# Patient Record
Sex: Male | Born: 1975 | Hispanic: Yes | Marital: Single | Smoking: Current every day smoker
Health system: Southern US, Community
[De-identification: ages and names within clinical notes are randomized; demographics above are authoritative.]

## PROBLEM LIST (undated history)

## (undated) DIAGNOSIS — F1910 Other psychoactive substance abuse, uncomplicated: Secondary | ICD-10-CM

## (undated) DIAGNOSIS — K529 Noninfective gastroenteritis and colitis, unspecified: Secondary | ICD-10-CM

---

## 2020-06-13 ENCOUNTER — Encounter: Payer: Self-pay | Admitting: Medical Oncology

## 2020-06-13 ENCOUNTER — Other Ambulatory Visit: Payer: Self-pay

## 2020-06-13 ENCOUNTER — Observation Stay
Admission: EM | Admit: 2020-06-13 | Discharge: 2020-06-14 | Disposition: A | Discharge status: Skilled Nursing Facility | Payer: Self-pay | Attending: Internal Medicine | Admitting: Internal Medicine

## 2020-06-13 ENCOUNTER — Emergency Department: Payer: Self-pay

## 2020-06-13 DIAGNOSIS — E870 Hyperosmolality and hypernatremia: Secondary | ICD-10-CM | POA: Insufficient documentation

## 2020-06-13 DIAGNOSIS — R030 Elevated blood-pressure reading, without diagnosis of hypertension: Secondary | ICD-10-CM | POA: Insufficient documentation

## 2020-06-13 DIAGNOSIS — E860 Dehydration: Secondary | ICD-10-CM | POA: Insufficient documentation

## 2020-06-13 DIAGNOSIS — F1411 Cocaine abuse, in remission: Secondary | ICD-10-CM | POA: Insufficient documentation

## 2020-06-13 DIAGNOSIS — N179 Acute kidney failure, unspecified: Secondary | ICD-10-CM | POA: Insufficient documentation

## 2020-06-13 DIAGNOSIS — F1211 Cannabis abuse, in remission: Secondary | ICD-10-CM | POA: Insufficient documentation

## 2020-06-13 DIAGNOSIS — R112 Nausea with vomiting, unspecified: Principal | ICD-10-CM | POA: Diagnosis present

## 2020-06-13 DIAGNOSIS — F17210 Nicotine dependence, cigarettes, uncomplicated: Secondary | ICD-10-CM | POA: Insufficient documentation

## 2020-06-13 DIAGNOSIS — F1511 Other stimulant abuse, in remission: Secondary | ICD-10-CM | POA: Insufficient documentation

## 2020-06-13 DIAGNOSIS — F1910 Other psychoactive substance abuse, uncomplicated: Secondary | ICD-10-CM | POA: Clinically undetermined

## 2020-06-13 DIAGNOSIS — Z720 Tobacco use: Secondary | ICD-10-CM | POA: Diagnosis present

## 2020-06-13 DIAGNOSIS — Z20822 Contact with and (suspected) exposure to covid-19: Secondary | ICD-10-CM | POA: Insufficient documentation

## 2020-06-13 HISTORY — DX: Other psychoactive substance abuse, uncomplicated: F19.10

## 2020-06-13 LAB — CBC
HCT: 44.8 % (ref 39.0–52.0)
Hemoglobin: 14.6 g/dL (ref 13.0–17.0)
MCH: 29.3 pg (ref 26.0–34.0)
MCHC: 32.6 g/dL (ref 30.0–36.0)
MCV: 90 fL (ref 80.0–100.0)
Platelets: 293 10*3/uL (ref 150–400)
RBC: 4.98 MIL/uL (ref 4.22–5.81)
RDW: 14.2 % (ref 11.5–15.5)
WBC: 11.5 10*3/uL — ABNORMAL HIGH (ref 4.0–10.5)
nRBC: 0 % (ref 0.0–0.2)

## 2020-06-13 LAB — LIPASE, BLOOD: Lipase: 45 U/L (ref 11–51)

## 2020-06-13 LAB — URINALYSIS, COMPLETE (UACMP) WITH MICROSCOPIC
Bacteria, UA: NONE SEEN
Bilirubin Urine: NEGATIVE
Glucose, UA: NEGATIVE mg/dL
Hgb urine dipstick: NEGATIVE
Ketones, ur: NEGATIVE mg/dL
Leukocytes,Ua: NEGATIVE
Nitrite: NEGATIVE
Protein, ur: NEGATIVE mg/dL
Specific Gravity, Urine: >1.046 — ABNORMAL HIGH (ref 1.005–1.030)
Squamous Epithelial / HPF: NONE SEEN (ref 0–5)
pH: 8 (ref 5.0–8.0)

## 2020-06-13 LAB — COMPREHENSIVE METABOLIC PANEL
ALT: 15 U/L (ref 0–44)
AST: 21 U/L (ref 15–41)
Albumin: 5 g/dL (ref 3.5–5.0)
Alkaline Phosphatase: 41 U/L (ref 38–126)
Anion gap: 12 (ref 5–15)
BUN: 18 mg/dL (ref 6–20)
CO2: 26 mmol/L (ref 22–32)
Calcium: 10.2 mg/dL (ref 8.9–10.3)
Chloride: 109 mmol/L (ref 98–111)
Creatinine, Ser: 1.31 mg/dL — ABNORMAL HIGH (ref 0.61–1.24)
GFR calc Af Amer: >60 mL/min (ref >60)
GFR calc non Af Amer: >60 mL/min (ref >60)
Glucose, Bld: 145 mg/dL — ABNORMAL HIGH (ref 70–99)
Potassium: 4.7 mmol/L (ref 3.5–5.1)
Sodium: 147 mmol/L — ABNORMAL HIGH (ref 135–145)
Total Bilirubin: 1.2 mg/dL (ref 0.3–1.2)
Total Protein: 8.1 g/dL (ref 6.5–8.1)

## 2020-06-13 LAB — URINE DRUG SCREEN, QUALITATIVE (ARMC ONLY)
Amphetamines, Ur Screen: NOT DETECTED
Barbiturates, Ur Screen: NOT DETECTED
Benzodiazepine, Ur Scrn: NOT DETECTED
Cannabinoid 50 Ng, Ur ~~LOC~~: NOT DETECTED
Cocaine Metabolite,Ur ~~LOC~~: NOT DETECTED
MDMA (Ecstasy)Ur Screen: NOT DETECTED
Methadone Scn, Ur: NOT DETECTED
Opiate, Ur Screen: POSITIVE — AB
Phencyclidine (PCP) Ur S: NOT DETECTED
Tricyclic, Ur Screen: NOT DETECTED

## 2020-06-13 LAB — ETHANOL: Alcohol, Ethyl (B): <10 mg/dL (ref <10)

## 2020-06-13 LAB — SARS CORONAVIRUS 2 BY RT PCR (HOSPITAL ORDER, PERFORMED IN ~~LOC~~ HOSPITAL LAB): SARS Coronavirus 2: NEGATIVE

## 2020-06-13 LAB — LACTIC ACID, PLASMA: Lactic Acid, Venous: 2.7 mmol/L (ref 0.5–1.9)

## 2020-06-13 MED ORDER — ACETAMINOPHEN 325 MG PO TABS
650.0000 mg | ORAL_TABLET | Freq: Once | ORAL | Status: DC
  Filled 2020-06-13: qty 2

## 2020-06-13 MED ORDER — HALOPERIDOL LACTATE 5 MG/ML IJ SOLN
INTRAMUSCULAR | Status: AC
  Filled 2020-06-13: qty 1

## 2020-06-13 MED ORDER — MORPHINE SULFATE (PF) 4 MG/ML IV SOLN
4.0000 mg | Freq: Once | INTRAVENOUS | Status: AC
  Administered 2020-06-13: 4 mg via INTRAVENOUS
  Filled 2020-06-13: qty 1

## 2020-06-13 MED ORDER — DROPERIDOL 2.5 MG/ML IJ SOLN
5.0000 mg | Freq: Once | INTRAMUSCULAR | Status: DC
  Filled 2020-06-13: qty 2

## 2020-06-13 MED ORDER — NICOTINE 7 MG/24HR TD PT24
7.0000 mg | MEDICATED_PATCH | Freq: Every day | TRANSDERMAL | Status: DC
  Filled 2020-06-13 (×3): qty 1

## 2020-06-13 MED ORDER — HYDRALAZINE HCL 20 MG/ML IJ SOLN
10.0000 mg | Freq: Four times a day (QID) | INTRAMUSCULAR | Status: DC | PRN
  Filled 2020-06-13: qty 0.5

## 2020-06-13 MED ORDER — LORAZEPAM 2 MG/ML IJ SOLN
1.0000 mg | Freq: Four times a day (QID) | INTRAMUSCULAR | Status: DC | PRN
  Administered 2020-06-14: 1 mg via INTRAVENOUS
  Filled 2020-06-13: qty 1

## 2020-06-13 MED ORDER — LORAZEPAM 2 MG/ML IJ SOLN
1.0000 mg | Freq: Once | INTRAMUSCULAR | Status: AC
  Administered 2020-06-13: 1 mg via INTRAVENOUS
  Filled 2020-06-13: qty 1

## 2020-06-13 MED ORDER — IOHEXOL 300 MG/ML  SOLN
100.0000 mL | Freq: Once | INTRAMUSCULAR | Status: AC | PRN
  Administered 2020-06-13: 100 mL via INTRAVENOUS
  Filled 2020-06-13: qty 100

## 2020-06-13 MED ORDER — HALOPERIDOL LACTATE 5 MG/ML IJ SOLN
2.5000 mg | Freq: Once | INTRAMUSCULAR | Status: AC
  Administered 2020-06-13: 2.5 mg via INTRAVENOUS

## 2020-06-13 MED ORDER — ACETAMINOPHEN 325 MG PO TABS: 650.0000 mg | ORAL_TABLET | Freq: Four times a day (QID) | ORAL | Status: DC | PRN

## 2020-06-13 MED ORDER — ONDANSETRON HCL 4 MG/2ML IJ SOLN
4.0000 mg | Freq: Once | INTRAMUSCULAR | Status: AC
  Administered 2020-06-13: 4 mg via INTRAVENOUS
  Filled 2020-06-13: qty 2

## 2020-06-13 MED ORDER — ONDANSETRON HCL 4 MG/2ML IJ SOLN
4.0000 mg | Freq: Four times a day (QID) | INTRAMUSCULAR | Status: DC | PRN
  Administered 2020-06-14: 4 mg via INTRAVENOUS
  Filled 2020-06-13: qty 2

## 2020-06-13 MED ORDER — ACETAMINOPHEN 650 MG RE SUPP: 650.0000 mg | Freq: Four times a day (QID) | RECTAL | Status: DC | PRN

## 2020-06-13 MED ORDER — KETOROLAC TROMETHAMINE 15 MG/ML IJ SOLN
15.0000 mg | Freq: Four times a day (QID) | INTRAMUSCULAR | Status: DC | PRN
  Administered 2020-06-13 – 2020-06-14 (×2): 15 mg via INTRAVENOUS
  Filled 2020-06-13 (×3): qty 1

## 2020-06-13 MED ORDER — PANTOPRAZOLE SODIUM 40 MG IV SOLR
40.0000 mg | Freq: Two times a day (BID) | INTRAVENOUS | Status: DC
  Administered 2020-06-13 – 2020-06-14 (×2): 40 mg via INTRAVENOUS
  Filled 2020-06-13 (×3): qty 40

## 2020-06-13 MED ORDER — ONDANSETRON HCL 4 MG PO TABS
4.0000 mg | ORAL_TABLET | Freq: Four times a day (QID) | ORAL | Status: DC | PRN
  Administered 2020-06-14: 4 mg via ORAL
  Filled 2020-06-13: qty 1

## 2020-06-13 MED ORDER — DROPERIDOL 2.5 MG/ML IJ SOLN: 2.5000 mg | Freq: Once | INTRAMUSCULAR | Status: DC

## 2020-06-13 MED ORDER — LACTATED RINGERS IV SOLN: INTRAVENOUS | Status: DC

## 2020-06-13 MED ORDER — METOCLOPRAMIDE HCL 5 MG/ML IJ SOLN
5.0000 mg | Freq: Three times a day (TID) | INTRAMUSCULAR | Status: DC
  Administered 2020-06-13 – 2020-06-14 (×3): 5 mg via INTRAVENOUS
  Filled 2020-06-13 (×4): qty 2

## 2020-06-13 MED ORDER — LACTATED RINGERS IV BOLUS
1000.0000 mL | Freq: Once | INTRAVENOUS | Status: AC
  Administered 2020-06-13: 1000 mL via INTRAVENOUS

## 2020-06-13 MED ORDER — DROPERIDOL 2.5 MG/ML IJ SOLN
2.5000 mg | Freq: Once | INTRAMUSCULAR | Status: AC
  Administered 2020-06-13: 2.5 mg via INTRAVENOUS

## 2020-06-13 MED ORDER — KETOROLAC TROMETHAMINE 30 MG/ML IJ SOLN
15.0000 mg | Freq: Once | INTRAMUSCULAR | Status: AC
  Administered 2020-06-13: 15 mg via INTRAVENOUS
  Filled 2020-06-13: qty 1

## 2020-06-13 MED ORDER — ENOXAPARIN SODIUM 40 MG/0.4ML ~~LOC~~ SOLN
40.0000 mg | SUBCUTANEOUS | Status: DC
  Filled 2020-06-13: qty 0.4

## 2020-06-13 NOTE — ED Notes (Signed)
Per ed tech, pt requesting a shower.

## 2020-06-13 NOTE — H&P (Signed)
History and Physical    Bruce Buchanan ZOX:[id number] DOB: 1976-06-16 DOA: 06/13/2020  PCP: Patient, No Pcp Per   Patient coming from: Home   Chief Complaint: Intractable nausea and vomiting  HPI: Bruce Buchanan is a 44 y.o. male with medical history of polysubstance abuse (reports cocaine, marijuana and methamphetamine use) who presented to the ED with intractable nausea and vomiting with mid abdominal pain since yesterday.  Patient reports that he quit both cocaine and meth use about 45 days ago and marijuana sometime prior to that.  Since then he has been having intermittent nausea with vomiting and abdominal cramps every 2-3 days.  Denies any similar symptoms prior to this. Since last evening he has had several episodes of nausea with vomiting, initially of food then dry heaving.  Complains of diffuse crampy abdominal pain.  Denies eating anything outside, alcohol use, any other illicit drug use, over-the-counter NSAIDs or supplements.  Denies being on any medication.  He continues to smoke about 4 cigarettes a day.  Denies any sick contact or recent travel.  Reports getting fully vaccinated several weeks ago. Reports some headache and dizziness but no blurred vision, fevers, chills, hematemesis, melena, diarrhea, chest pain, palpitation, shortness of breath, muscle aches or joint pain.  Reports some dysuria but no hematuria.  Denies any weight loss.     ED Course: Patient had elevated blood pressure of 170/100s, heart rate in low 100s, afebrile, normal respiratory rate and O2 sat on room air. Blood work done showed WBC of 11.5 with normal hemoglobin and platelets.  Chemistry showed sodium of 147, glucose of 145, creatinine 1.31.  LFTs and lipase were normal.  COVID-19 PCR pending.  CT abdomen of the pelvis with contrast without any acute findings Patient given 1 L of Ringer's lactate bolus, droperidol, Haldol, 4 mg IV morphine and Zofran with no significant improvement.  He was given some  ginger ale which he could not keep down. Hospitalist consulted for observation to medical floor.   Review of Systems: As per HPI otherwise all other systems reviewed and are negative.  Past Medical History:  Diagnosis Date  . Drug abuse [facility])      Social History Reports smoking 4 cigarettes a day.  Denies alcohol use and quit polysubstance use (marijuana, cocaine and meth over 6 weeks ago) No Known Allergies  No family history of heart disease or diabetes.  Prior to Admission medications   Not on File    Physical Exam: Vitals:   06/13/20 1052 06/13/20 1400 06/13/20 1500  BP: (!) 143/93 (!) 171/108 (!) 171/104  Pulse: 98 91 (!) 103  Resp: 20 (!) 21 (!) 22  Temp: 98.3 F (36.8 C)    TempSrc: Oral    SpO2: 100% 99% 100%  Weight: 61.2 kg    Height: 5\' 5"  (1.651 m)      Constitutional: NAD, calm, comfortable Vitals:   06/13/20 1052 06/13/20 1400 06/13/20 1500  BP: (!) 143/93 (!) 171/108 (!) 171/104  Pulse: 98 91 (!) 103  Resp: 20 (!) 21 (!) 22  Temp: 98.3 F (36.8 C)    TempSrc: Oral    SpO2: 100% 99% 100%  Weight: 61.2 kg    Height: 5\' 5"  (1.651 m)    General: Middle-aged male lying in bed appears fatigued, not in distress Eyes: Pupils reactive bilaterally, EOMI ENMT: No pallor, no icterus, dry oral mucosa, supple neck, no cervical dependably Chest: Clear to auscultation bilaterally, no added sound CVs: S1-S2 tachycardic, no murmurs rub or  gallop GI: Soft, nondistended, bowel sounds present, diffuse abdominal tenderness mainly over the mid abdomen Musculoskeletal: Warm, no edema CNS: Alert and oriented, no tremors     Labs on Admission: I have personally reviewed following labs and imaging studies  CBC: Recent Labs  Lab 06/13/20 1114  WBC 11.5*  HGB 14.6  HCT 44.8  MCV 90.0  PLT 293    Basic Metabolic Panel: Recent Labs  Lab 06/13/20 1114  NA 147*  K 4.7  CL 109  CO2 26  GLUCOSE 145*  BUN 18  CREATININE 1.31*  CALCIUM 10.2     GFR: Estimated Creatinine Clearance: 63.6 mL/min (A) (by C-G formula based on SCr of 1.31 mg/dL (H)).  Liver Function Tests: Recent Labs  Lab 06/13/20 1114  AST 21  ALT 15  ALKPHOS 41  BILITOT 1.2  PROT 8.1  ALBUMIN 5.0    Urine analysis: No results found for: COLORURINE, APPEARANCEUR, LABSPEC, PHURINE, GLUCOSEU, HGBUR, BILIRUBINUR, KETONESUR, PROTEINUR, UROBILINOGEN, NITRITE, LEUKOCYTESUR  Radiological Exams on Admission: CT ABDOMEN PELVIS W CONTRAST  Result Date: 06/13/2020 CLINICAL DATA:  Abdominal pain, nausea, vomiting. EXAM: CT ABDOMEN AND PELVIS WITH CONTRAST TECHNIQUE: Multidetector CT imaging of the abdomen and pelvis was performed using the standard protocol following bolus administration of intravenous contrast. CONTRAST:  OMNIPAQUE IOHEXOL 300 MG/ML  SOLN COMPARISON:  None. FINDINGS: Lower chest: No acute abnormality. Hepatobiliary: No focal liver abnormality is seen. No gallstones, gallbladder wall thickening, or biliary dilatation. Pancreas: Unremarkable. No pancreatic ductal dilatation or surrounding inflammatory changes. Spleen: Normal in size without focal abnormality. Adrenals/Urinary Tract: Adrenal glands appear normal. 3.8 cm simple cyst is seen in lower pole of right kidney. No hydronephrosis or renal obstruction is noted. No renal or ureteral calculi are noted. Urinary bladder is unremarkable. Stomach/Bowel: Stomach is within normal limits. Appendix appears normal. No evidence of bowel wall thickening, distention, or inflammatory changes. Vascular/Lymphatic: No significant vascular findings are present. No enlarged abdominal or pelvic lymph nodes. Reproductive: Prostate is unremarkable. Other: No abdominal wall hernia or abnormality. No abdominopelvic ascites. Musculoskeletal: No acute or significant osseous findings. IMPRESSION: No acute abnormality seen in the abdomen or pelvis. Electronically Signed   By: Lupita Raider M.D.   On: 06/13/2020 14:53    EKG:  Normal sinus rhythm with no ST-T changes, normal QTC.  Assessment/Plan Principal Problem:   Intractable nausea and vomiting No clear etiology.  Unlikely drug withdrawal if we follow patients information on being substance free for past 45 days. UA and urine drug screen pending.  IV hydration with Ringer's lactate, scheduled low-dose Reglan and as needed Zofran.  IV PPI twice daily.  CT abdomen pelvis obscuring.  Lipase normal.  Check lactic acid. Patient asking for pain medication but I have told him he will get nonnarcotic medications (e.g. Toradol only)  Active Problems:   Polysubstance abuse (HCC) As above.  Check urine drug screen.    Tobacco abuse Counseled on cessation.  Nicotine patch    Hypernatremia Secondary to dehydration.  IV fluids    AKI (acute kidney injury) (HCC) Prerenal secondary to dehydration.  IV fluids.    Elevated blood-pressure reading without diagnosis of hypertension Likely due to acute symptoms.  No history of hypertension.  As needed hydralazine   DVT prophylaxis: Subcu Lovenox Code Status:   Full code Family Communication:  None Disposition Plan:   Patient is from:  Group home  Anticipated DC to:  Return back to self  Anticipated DC date:  6/16  Anticipated  DC barriers: Acute symptoms  Consults called:  None Admission status:  Observation  Severity of Illness: The appropriate patient status for this patient is OBSERVATION. Observation status is judged to be reasonable and necessary in order to provide the required intensity of service to ensure the patient's safety. The patient's presenting symptoms, physical exam findings, and initial radiographic and laboratory data in the context of their medical condition is felt to place them at decreased risk for further clinical deterioration. Furthermore, it is anticipated that the patient will be medically stable for discharge from the hospital within 2 midnights of admission. The following factors support  the patient status of observation.   " The patient's presenting symptoms include intractable nausea and vomiting, AKI. " The physical exam findings include dehydration " The initial radiographic and laboratory data are acute kidney injury      Jeselle Hiser MD [facility]  How to contact the [facility] Attending or Consulting provider [place] or covering provider during after hours [place], for this patient?   1. Check the care team in [facility] and look for a) attending/consulting [facility] provider listed and b) the [facility] team listed 2. Log into [email or web address] and use [facility] universal password to access. If you do not have the password, please contact the hospital operator. 3. Locate the [facility] provider you are looking for under [facility] and page to a number that you can be directly reached. 4. If you still have difficulty reaching the provider, please page the [facility] (Director on Call) for the Hospitalists listed on amion for assistance.  06/13/2020, 5:14 PM

## 2020-06-13 NOTE — ED Provider Notes (Signed)
[facility] Emergency Department Provider Note  ____________________________________________   None    (approximate)  I have reviewed the triage vital signs and the nursing notes.   HISTORY  Chief Complaint Emesis and Abdominal Pain    HPI Bruce Buchanan is a 44 y.o. male with past medical history of cocaine abuse currently reportedly clean for the last month here with abdominal pain, nausea, and vomiting.  The patient states that he has been clean for approximately the last 30 days.  He states that he was able to get clean on his own.  Since then, however, he has noticed increasingly severe nausea and emesis.  Has had intermittent abdominal pain and cramping with this.  It began after he stopped but has persisted.  He denies any history of prior symptoms like this.  No history of gastroparesis.  No known history of gallstones.  The pain is aching, gnawing, generalized.  He has had associated intermittent vomiting with it.  He does smoke, but denies smoking marijuana.  No other drug use.  Denies any heavy alcohol use.  He is requesting something for pain, denies any other complaints.        Past Medical History:  Diagnosis Date  . Drug abuse [facility])     Patient Active Problem List   Diagnosis Date Noted  . Intractable nausea and vomiting 06/13/2020  . Polysubstance abuse ([place]) 06/13/2020  . Tobacco abuse 06/13/2020  . Hypernatremia 06/13/2020  . AKI (acute kidney injury) ([place]) 06/13/2020  . Elevated blood-pressure reading without diagnosis of hypertension 06/13/2020      Prior to Admission medications   Not on File    Allergies Patient has no known allergies.  No family history on file.  Social History Social History   Tobacco Use  . Smoking status: Not on file  Substance Use Topics  . Alcohol use: Not on file  . Drug use: Not on file    Review of Systems  Review of Systems  Constitutional: Positive for fatigue. Negative for chills  and fever.  HENT: Negative for sore throat.   Respiratory: Negative for shortness of breath.   Cardiovascular: Negative for chest pain.  Gastrointestinal: Positive for abdominal pain, nausea and vomiting.  Genitourinary: Negative for flank pain.  Musculoskeletal: Negative for neck pain.  Skin: Negative for rash and wound.  Allergic/Immunologic: Negative for immunocompromised state.  Neurological: Negative for weakness and numbness.  Hematological: Does not bruise/bleed easily.  All other systems reviewed and are negative.    ____________________________________________  PHYSICAL EXAM:      VITAL SIGNS: ED Triage Vitals [06/13/20 1052]  Enc Vitals Group     BP (!) 143/93     Pulse Rate 98     Resp 20     Temp 98.3 F (36.8 C)     Temp Source Oral     SpO2 100 %     Weight 135 lb (61.2 kg)     Height '5\' 5"'  (1.651 m)     Head Circumference      Peak Flow      Pain Score 10     Pain Loc      Pain Edu?      Excl. in [place]?      Physical Exam Vitals and nursing note reviewed.  Constitutional:      General: He is not in acute distress.    Appearance: He is well-developed.     Comments: Retching loudly but not vomiting  HENT:  Head: Normocephalic and atraumatic.  Eyes:     Conjunctiva/sclera: Conjunctivae normal.  Cardiovascular:     Rate and Rhythm: Normal rate and regular rhythm.     Heart sounds: Normal heart sounds. No murmur heard.  No friction rub.  Pulmonary:     Effort: Pulmonary effort is normal. No respiratory distress.     Breath sounds: Normal breath sounds. No wheezing or rales.  Abdominal:     General: There is no distension.     Palpations: Abdomen is soft.     Tenderness: There is no abdominal tenderness.  Musculoskeletal:     Cervical back: Neck supple.  Skin:    General: Skin is warm.     Capillary Refill: Capillary refill takes less than 2 seconds.  Neurological:     Mental Status: He is alert and oriented to person, place, and time.      Motor: No abnormal muscle tone.       ____________________________________________   LABS (all labs ordered are listed, but only abnormal results are displayed)  Labs Reviewed  COMPREHENSIVE METABOLIC PANEL - Abnormal; Notable for the following components:      Result Value   Sodium 147 (*)    Glucose, Bld 145 (*)    Creatinine, Ser 1.31 (*)    All other components within normal limits  CBC - Abnormal; Notable for the following components:   WBC 11.5 (*)    All other components within normal limits  LACTIC ACID, PLASMA - Abnormal; Notable for the following components:   Lactic Acid, Venous 3.7 (*)    All other components within normal limits  SARS CORONAVIRUS 2 BY RT PCR (HOSPITAL ORDER, [place] [facility])  LIPASE, BLOOD  URINALYSIS, COMPLETE (UACMP) WITH MICROSCOPIC  URINE DRUG SCREEN, QUALITATIVE ([facility] ONLY)  LACTIC ACID, PLASMA  HIV ANTIBODY (ROUTINE TESTING W REFLEX)  CBC  CREATININE, SERUM  BASIC METABOLIC PANEL  ETHANOL    ____________________________________________  EKG sinus tachycardia, ventricular rate 106.  PR 146, QRS 84, QTc 433.  No acute ST elevations or depressions.  No EKG evidence of acute ischemia or infarct. ________________________________________  RADIOLOGY All imaging, including plain films, CT scans, and ultrasounds, independently reviewed by me, and interpretations confirmed via formal radiology reads.  ED MD interpretation:   None  Official radiology report(s): CT ABDOMEN PELVIS W CONTRAST  Result Date: 06/13/2020 CLINICAL DATA:  Abdominal pain, nausea, vomiting. EXAM: CT ABDOMEN AND PELVIS WITH CONTRAST TECHNIQUE: Multidetector CT imaging of the abdomen and pelvis was performed using the standard protocol following bolus administration of intravenous contrast. CONTRAST:  146m OMNIPAQUE IOHEXOL 300 MG/ML  SOLN COMPARISON:  None. FINDINGS: Lower chest: No acute abnormality. Hepatobiliary: No focal liver abnormality is  seen. No gallstones, gallbladder wall thickening, or biliary dilatation. Pancreas: Unremarkable. No pancreatic ductal dilatation or surrounding inflammatory changes. Spleen: Normal in size without focal abnormality. Adrenals/Urinary Tract: Adrenal glands appear normal. 3.8 cm simple cyst is seen in lower pole of right kidney. No hydronephrosis or renal obstruction is noted. No renal or ureteral calculi are noted. Urinary bladder is unremarkable. Stomach/Bowel: Stomach is within normal limits. Appendix appears normal. No evidence of bowel wall thickening, distention, or inflammatory changes. Vascular/Lymphatic: No significant vascular findings are present. No enlarged abdominal or pelvic lymph nodes. Reproductive: Prostate is unremarkable. Other: No abdominal wall hernia or abnormality. No abdominopelvic ascites. Musculoskeletal: No acute or significant osseous findings. IMPRESSION: No acute abnormality seen in the abdomen or pelvis. Electronically Signed   By:  Marijo Conception M.D.   On: 06/13/2020 14:53    ____________________________________________  PROCEDURES   Procedure(s) performed (including Critical Care):  Procedures  ____________________________________________  INITIAL IMPRESSION / MDM / [place] / ED COURSE  As part of my medical decision making, I reviewed the following data within the [place] notes reviewed and incorporated, Old chart reviewed, Notes from prior ED visits, and [place] Controlled Substance Database       *Bruce Buchanan was evaluated in Emergency Department on 06/13/2020 for the symptoms described in the history of present illness. He was evaluated in the context of the global COVID-19 pandemic, which necessitated consideration that the patient might be at risk for infection with the SARS-CoV-2 virus that causes COVID-19. Institutional protocols and algorithms that pertain to the evaluation of patients at risk for COVID-19 are in a state  of rapid change based on information released by regulatory bodies including the CDC and federal and state organizations. These policies and algorithms were followed during the patient's care in the ED.  Some ED evaluations and interventions may be delayed as a result of limited staffing during the pandemic.*  Clinical Course as of Jun 13 1746  Tue Jun 13, 2052  5043 44 year old male here with abdominal pain, nausea, and vomiting since stopping cocaine.  Suspect gastritis, withdrawal, possible GERD, THC induced hyperemesis.  He has no focal right upper quadrant tenderness, normal LFTs and alk phos, and no evidence to suggest cholecystitis.  His CT scan is reassuring.  He has no urinary symptoms to suggest stone or UTI.   [CI]    Clinical Course User Index [CI] Duffy Bruce, MD    Medical Decision Making: As above.  CT scan is reassuring.  Despite multiple doses of medication, patient is persistently vomiting.  Unclear etiology.  EKG is nonischemic.  Will admit for intractable nausea and vomiting and further work-up.  ____________________________________________  FINAL CLINICAL IMPRESSION(S) / ED DIAGNOSES  Final diagnoses:  Intractable nausea and vomiting     MEDICATIONS GIVEN DURING THIS VISIT:  Medications  acetaminophen (TYLENOL) tablet 650 mg (has no administration in time range)  enoxaparin (LOVENOX) injection 40 mg (has no administration in time range)  lactated ringers infusion (has no administration in time range)  acetaminophen (TYLENOL) tablet 650 mg (has no administration in time range)    Or  acetaminophen (TYLENOL) suppository 650 mg (has no administration in time range)  ondansetron (ZOFRAN) tablet 4 mg (has no administration in time range)    Or  ondansetron (ZOFRAN) injection 4 mg (has no administration in time range)  nicotine (NICODERM CQ - dosed in mg/24 hr) patch 7 mg (has no administration in time range)  metoCLOPramide (REGLAN) injection 5 mg (has no  administration in time range)  hydrALAZINE (APRESOLINE) injection 10 mg (has no administration in time range)  pantoprazole (PROTONIX) injection 40 mg (has no administration in time range)  lactated ringers bolus 1,000 mL (1,000 mLs Intravenous New Bag/Given 06/13/20 1328)  ketorolac (TORADOL) 30 MG/ML injection 15 mg (15 mg Intravenous Given 06/13/20 1330)  droperidol (INAPSINE) 2.5 MG/ML injection 2.5 mg (2.5 mg Intravenous Given 06/13/20 1330)  morphine 4 MG/ML injection 4 mg (4 mg Intravenous Given 06/13/20 1513)  iohexol (OMNIPAQUE) 300 MG/ML solution 100 mL (100 mLs Intravenous Contrast Given 06/13/20 1437)  ondansetron (ZOFRAN) injection 4 mg (4 mg Intravenous Given 06/13/20 1512)  haloperidol lactate (HALDOL) injection 2.5 mg (2.5 mg Intravenous Given 06/13/20 1555)  LORazepam (ATIVAN) injection 1  mg (1 mg Intravenous Given 06/13/20 1611)     ED Discharge Orders    None       Note:  This document was prepared using Dragon voice recognition software and may include unintentional dictation errors.   Duffy Bruce, MD 06/13/20 [phone]

## 2020-06-13 NOTE — ED Triage Notes (Signed)
Pt in via EMS from [facility]. EMS reports pt with abd pain for 1 month and coming off of opoid's. Last took some 40 days ago per pt. Pt started vomiting this am. FSBS 143, 153/93, HR 95

## 2020-06-13 NOTE — ED Notes (Signed)
4 oz ginger ale given, patient vomited ginger ale back up.  Dr. Erma Heritage alerted.

## 2020-06-13 NOTE — ED Notes (Signed)
Pt unable to be still in triage- walking around and refusing to have labs drawn at this time.

## 2020-06-13 NOTE — ED Notes (Signed)
Rainbow sent to the lab.  

## 2020-06-13 NOTE — Progress Notes (Signed)
Received patient into 141. Patient went into shower and is lying down with water running on him. He refused to get out of shower. Will continue to monitor.

## 2020-06-14 LAB — BASIC METABOLIC PANEL
Anion gap: 11 (ref 5–15)
BUN: 16 mg/dL (ref 6–20)
CO2: 26 mmol/L (ref 22–32)
Calcium: 9.1 mg/dL (ref 8.9–10.3)
Chloride: 108 mmol/L (ref 98–111)
Creatinine, Ser: 1.19 mg/dL (ref 0.61–1.24)
GFR calc Af Amer: >60 mL/min (ref >60)
GFR calc non Af Amer: >60 mL/min (ref >60)
Glucose, Bld: 95 mg/dL (ref 70–99)
Potassium: 3.4 mmol/L — ABNORMAL LOW (ref 3.5–5.1)
Sodium: 145 mmol/L (ref 135–145)

## 2020-06-14 LAB — LACTIC ACID, PLASMA
Lactic Acid, Venous: 3.7 mmol/L (ref 0.5–1.9)
Lactic Acid, Venous: 2 mmol/L (ref 0.5–1.9)
Lactic Acid, Venous: 1.5 mmol/L (ref 0.5–1.9)

## 2020-06-14 LAB — MAGNESIUM: Magnesium: 2.3 mg/dL (ref 1.7–2.4)

## 2020-06-14 LAB — HIV ANTIBODY (ROUTINE TESTING W REFLEX): HIV Screen 4th Generation wRfx: NONREACTIVE

## 2020-06-14 MED ORDER — POTASSIUM CHLORIDE CRYS ER 20 MEQ PO TBCR
40.0000 meq | EXTENDED_RELEASE_TABLET | Freq: Once | ORAL | Status: AC
  Administered 2020-06-14: 40 meq via ORAL
  Filled 2020-06-14: qty 2

## 2020-06-14 MED ORDER — QUETIAPINE FUMARATE 25 MG PO TABS: 25.0000 mg | ORAL_TABLET | Freq: Every day | ORAL | Status: DC

## 2020-06-14 MED ORDER — ONDANSETRON HCL 4 MG PO TABS: 4.0000 mg | ORAL_TABLET | Freq: Four times a day (QID) | ORAL | 0 refills | Status: AC | PRN

## 2020-06-14 MED ORDER — PANTOPRAZOLE SODIUM 40 MG PO TBEC: 40.0000 mg | DELAYED_RELEASE_TABLET | Freq: Every day | ORAL | 1 refills | Status: AC

## 2020-06-14 NOTE — TOC Initial Note (Signed)
Transition of Care [facility]) - Initial/Assessment Note    Patient Details  Name: Bruce Buchanan MRN: [id number] Date of Birth: June 28, 1976  [facility]) CM/SW Contact:    Su Hilt, RN Phone Number: 06/14/2020, 9:09 AM  Clinical Narrative:                  Met with the patient to discuss [place] and needs He has been staying at [facility] facility for the past 6 weeks, they provide transportation when needed I provided the patient with an [facility] application and he stated that he does not need it, I explained that to get medications at [facility] he will also need this application, he reluctantly took the application but stated the Drug Rehab facility has these applications already and they set him up with everything he needs NO needs at this time Expected Discharge Plan: [place] ([facility]) Barriers to Discharge: Continued Medical Work up   Patient Goals and CMS Choice Patient states their goals for this hospitalization and ongoing recovery are:: get better      Expected Discharge Plan and Services Expected Discharge Plan: [place] ([facility])       Living arrangements for the past 2 months: No permanent address (staying at [facility])                 DME Arranged: N/A         HH Arranged: NA          Prior Living Arrangements/Services Living arrangements for the past 2 months: No permanent address (staying at [facility]) Lives with:: Self Patient language and need for interpreter reviewed:: Yes Do you feel safe going back to the place where you live?: Yes      Need for Family Participation in Patient Care: No (Comment) Care giver support system in place?: No (comment)   Criminal Activity/Legal Involvement Pertinent to Current Situation/Hospitalization: No - Comment as needed  Activities of Daily Living      Permission Sought/Granted   Permission granted to share information with : Yes,  Verbal Permission Granted              Emotional Assessment Appearance:: Appears stated age Attitude/Demeanor/Rapport: Engaged Affect (typically observed): Appropriate Orientation: : Oriented to Situation, Oriented to  Time, Oriented to Place, Oriented to Self Alcohol / Substance Use: Illicit Drugs Psych Involvement: No (comment)  Admission diagnosis:  Intractable nausea and vomiting [R11.2] Patient Active Problem List   Diagnosis Date Noted  . Intractable nausea and vomiting 06/13/2020  . Polysubstance abuse ([place]) 06/13/2020  . Tobacco abuse 06/13/2020  . Hypernatremia 06/13/2020  . AKI (acute kidney injury) ([place]) 06/13/2020  . Elevated blood-pressure reading without diagnosis of hypertension 06/13/2020   PCP:  Patient, No Pcp Per Pharmacy:   [facility] [place], [id number] [place] [address] Phone: [phone] Fax: [phone]     Social Determinants of Health (SDOH) Interventions    Readmission Risk Interventions No flowsheet data found.

## 2020-06-14 NOTE — TOC Progression Note (Signed)
Transition of Care [facility]) - Progression Note    Patient Details  Name: Bruce Buchanan MRN: [id number] Date of Birth: 11-07-1976  Transition of Care [facility]) CM/SW Contact  Barrie Dunker, RN Phone Number: 06/14/2020, 1:03 PM  Clinical Narrative:    The patient is to discharge back to the [facility] rehab [facility] today, they will pick him up he says, He will pick up the meds at [facility]  I notified the nurse of the ability to pick up the meds at [facility] as well as they close at 1230-130 for lunch   Expected Discharge Plan: IP Rehab Facility ([facility]) Barriers to Discharge: Continued Medical Work up  Expected Discharge Plan and Services Expected Discharge Plan: IP Rehab Facility ([facility])       Living arrangements for the past 2 months: No permanent address (staying at [facility]) Expected Discharge Date: 06/14/20               DME Arranged: N/A         HH Arranged: NA           Social Determinants of Health (SDOH) Interventions    Readmission Risk Interventions No flowsheet data found.

## 2020-06-14 NOTE — Discharge Summary (Signed)
Physician Discharge Summary  Bruce Buchanan KGY:[id number] DOB: 1976/04/09 DOA: 06/13/2020  PCP: Patient, No Pcp Per  Admit date: 06/13/2020 Discharge date: 06/14/2020  Admitted From: Home Disposition:  Home  Recommendations for Outpatient Follow-up:  1. Follow up with PCP in 1-2 weeks 2. Please obtain BMP/CBC in one week 3. Please follow up on the following pending results:None  Home Health:No Equipment/Devices:None Discharge Condition: Stable CODE STATUS: Full Diet recommendation: Regular   Brief/Interim Summary: Bruce Buchanan is a 44 y.o. male with medical history of polysubstance abuse (reports cocaine, marijuana and methamphetamine use) who presented to the ED with intractable nausea and vomiting with mid abdominal pain since yesterday.  Patient reports that he quit both cocaine and meth use about 45 days ago and marijuana sometime prior to that.  Since then he has been having intermittent nausea with vomiting and abdominal cramps every 2-3 days.  Denies any similar symptoms prior to this. Since last evening he has had several episodes of nausea with vomiting, initially of food then dry heaving.  Complains of diffuse crampy abdominal pain.  CT abdomen without any acute abnormalities.  COVID-19 negative. Labs pretty much unremarkable.  Most likely secondary to gastroenteritis. Patient was able to tolerate p.o. intake next day.  He ate his breakfast and lunch without any difficulty.  He was provided with some Protonix and Zofran as needed for discharge and will follow up with PCP.  Patient did had mild AKI on presentation most likely secondary to dehydration.  Renal functions improved with IV fluid.  UDS was positive for opioids but patient did receive some morphine before collecting urine in ED for abdominal pain.  Discharge Diagnoses:  Principal Problem:   Intractable nausea and vomiting Active Problems:   Polysubstance abuse (HCC)   Tobacco abuse   Hypernatremia   AKI (acute  kidney injury) (HCC)   Elevated blood-pressure reading without diagnosis of hypertension  Discharge Instructions  Discharge Instructions    Diet - low sodium heart healthy   Complete by: As directed    Discharge instructions   Complete by: As directed    It was pleasure taking care of you. Please keep yourself well-hydrated.  You are being provided with Protonix to calm down your gastritis. You are also being provided with Zofran which you can use it as needed for nausea. Please follow-up with your primary care physician.   Increase activity slowly   Complete by: As directed      Allergies as of 06/14/2020   No Known Allergies     Medication List    TAKE these medications   ondansetron 4 MG tablet Commonly known as: ZOFRAN Take 1 tablet (4 mg total) by mouth every 6 (six) hours as needed for nausea.   pantoprazole 40 MG tablet Commonly known as: Protonix Take 1 tablet (40 mg total) by mouth daily.       No Known Allergies  Consultations:  None  Procedures/Studies: CT ABDOMEN PELVIS W CONTRAST  Result Date: 06/13/2020 CLINICAL DATA:  Abdominal pain, nausea, vomiting. EXAM: CT ABDOMEN AND PELVIS WITH CONTRAST TECHNIQUE: Multidetector CT imaging of the abdomen and pelvis was performed using the standard protocol following bolus administration of intravenous contrast. CONTRAST:  149mL OMNIPAQUE IOHEXOL 300 MG/ML  SOLN COMPARISON:  None. FINDINGS: Lower chest: No acute abnormality. Hepatobiliary: No focal liver abnormality is seen. No gallstones, gallbladder wall thickening, or biliary dilatation. Pancreas: Unremarkable. No pancreatic ductal dilatation or surrounding inflammatory changes. Spleen: Normal in size without focal abnormality. Adrenals/Urinary Tract: Adrenal  glands appear normal. 3.8 cm simple cyst is seen in lower pole of right kidney. No hydronephrosis or renal obstruction is noted. No renal or ureteral calculi are noted. Urinary bladder is unremarkable.  Stomach/Bowel: Stomach is within normal limits. Appendix appears normal. No evidence of bowel wall thickening, distention, or inflammatory changes. Vascular/Lymphatic: No significant vascular findings are present. No enlarged abdominal or pelvic lymph nodes. Reproductive: Prostate is unremarkable. Other: No abdominal wall hernia or abnormality. No abdominopelvic ascites. Musculoskeletal: No acute or significant osseous findings. IMPRESSION: No acute abnormality seen in the abdomen or pelvis. Electronically Signed   By: Lupita Raider M.D.   On: 06/13/2020 14:53     Subjective: Patient was feeling better when seen today.  Abdominal pain is improving.  He was able to eat some fruit in the breakfast.  He did experience some nausea earlier in the morning which resolved with Zofran.  Discharge Exam: Vitals:   06/14/20 0017 06/14/20 0809  BP: (!) 97/59 128/90  Pulse: 78 92  Resp: 20 16  Temp: 98 F (36.7 C) 98.6 F (37 C)  SpO2: 100% 97%   Vitals:   06/13/20 1719 06/13/20 1930 06/14/20 0017 06/14/20 0809  BP:  (!) 141/94 (!) 97/59 128/90  Pulse: 97 (!) 105 78 92  Resp: (!) 24 18 20 16   Temp:   98 F (36.7 C) 98.6 F (37 C)  TempSrc:   Oral Oral  SpO2: 100% 99% 100% 97%  Weight:      Height:        General: Pt is alert, awake, not in acute distress Cardiovascular: RRR, S1/S2 +, no rubs, no gallops Respiratory: CTA bilaterally, no wheezing, no rhonchi Abdominal: Soft, NT, ND, bowel sounds + Extremities: no edema, no cyanosis   The results of significant diagnostics from this hospitalization (including imaging, microbiology, ancillary and laboratory) are listed below for reference.    Microbiology: Recent Results (from the past 240 hour(s))  SARS Coronavirus 2 by RT PCR (hospital order, performed in [facility] hospital lab) Nasopharyngeal Nasopharyngeal Swab     Status: None   Collection Time: 06/13/20  4:42 PM   Specimen: Nasopharyngeal Swab  Result Value Ref Range Status    SARS Coronavirus 2 NEGATIVE NEGATIVE Final    Comment: (NOTE) SARS-CoV-2 target nucleic acids are NOT DETECTED.  The SARS-CoV-2 RNA is generally detectable in upper and lower respiratory specimens during the acute phase of infection. The lowest concentration of SARS-CoV-2 viral copies this assay can detect is 250 copies / mL. A negative result does not preclude SARS-CoV-2 infection and should not be used as the sole basis for treatment or other patient management decisions.  A negative result may occur with improper specimen collection / handling, submission of specimen other than nasopharyngeal swab, presence of viral mutation(s) within the areas targeted by this assay, and inadequate number of viral copies (<250 copies / mL). A negative result must be combined with clinical observations, patient history, and epidemiological information.  Fact Sheet for Patients:   06/15/20  Fact Sheet for Healthcare Providers: BoilerBrush.com.cy  This test is not yet approved or  cleared by the https://pope.com/ FDA and has been authorized for detection and/or diagnosis of SARS-CoV-2 by FDA under an Emergency Use Authorization (EUA).  This EUA will remain in effect (meaning this test can be used) for the duration of the COVID-19 declaration under Section 564(b)(1) of the Act, 21 U.S.C. section 360bbb-3(b)(1), unless the authorization is terminated or revoked sooner.  Performed at  [facility], [address] [place], [place] [address]      Labs: BNP (last 3 results) No results for input(s): BNP in the last 8760 hours. Basic Metabolic Panel: Recent Labs  Lab 06/13/20 1114 06/14/20 0556 06/14/20 0819  NA 147* 145  --   K 4.7 3.4*  --   CL 109 108  --   CO2 26 26  --   GLUCOSE 145* 95  --   BUN 18 16  --   CREATININE 1.31* 1.19  --   CALCIUM 10.2 9.1  --   MG  --   --  2.3   Liver Function Tests: Recent Labs  Lab  06/13/20 1114  AST 21  ALT 15  ALKPHOS 41  BILITOT 1.2  PROT 8.1  ALBUMIN 5.0   Recent Labs  Lab 06/13/20 1114  LIPASE 45   No results for input(s): AMMONIA in the last 168 hours. CBC: Recent Labs  Lab 06/13/20 1114  WBC 11.5*  HGB 14.6  HCT 44.8  MCV 90.0  PLT 293   Cardiac Enzymes: No results for input(s): CKTOTAL, CKMB, CKMBINDEX, TROPONINI in the last 168 hours. BNP: Invalid input(s): POCBNP CBG: No results for input(s): GLUCAP in the last 168 hours. D-Dimer No results for input(s): DDIMER in the last 72 hours. Hgb A1c No results for input(s): HGBA1C in the last 72 hours. Lipid Profile No results for input(s): CHOL, HDL, LDLCALC, TRIG, CHOLHDL, LDLDIRECT in the last 72 hours. Thyroid function studies No results for input(s): TSH, T4TOTAL, T3FREE, THYROIDAB in the last 72 hours.  Invalid input(s): FREET3 Anemia work up No results for input(s): VITAMINB12, FOLATE, FERRITIN, TIBC, IRON, RETICCTPCT in the last 72 hours. Urinalysis    Component Value Date/Time   COLORURINE YELLOW (A) 06/13/2020 2056   APPEARANCEUR CLEAR (A) 06/13/2020 2056   LABSPEC >1.046 (H) 06/13/2020 2056   PHURINE 8.0 06/13/2020 2056   GLUCOSEU NEGATIVE 06/13/2020 2056   HGBUR NEGATIVE 06/13/2020 2056   BILIRUBINUR NEGATIVE 06/13/2020 2056   KETONESUR NEGATIVE 06/13/2020 2056   PROTEINUR NEGATIVE 06/13/2020 2056   NITRITE NEGATIVE 06/13/2020 2056   LEUKOCYTESUR NEGATIVE 06/13/2020 2056   Sepsis Labs Invalid input(s): PROCALCITONIN,  WBC,  LACTICIDVEN Microbiology Recent Results (from the past 240 hour(s))  SARS Coronavirus 2 by RT PCR (hospital order, performed in [facility] hospital lab) Nasopharyngeal Nasopharyngeal Swab     Status: None   Collection Time: 06/13/20  4:42 PM   Specimen: Nasopharyngeal Swab  Result Value Ref Range Status   SARS Coronavirus 2 NEGATIVE NEGATIVE Final    Comment: (NOTE) SARS-CoV-2 target nucleic acids are NOT DETECTED.  The SARS-CoV-2 RNA is  generally detectable in upper and lower respiratory specimens during the acute phase of infection. The lowest concentration of SARS-CoV-2 viral copies this assay can detect is 250 copies / mL. A negative result does not preclude SARS-CoV-2 infection and should not be used as the sole basis for treatment or other patient management decisions.  A negative result may occur with improper specimen collection / handling, submission of specimen other than nasopharyngeal swab, presence of viral mutation(s) within the areas targeted by this assay, and inadequate number of viral copies (<250 copies / mL). A negative result must be combined with clinical observations, patient history, and epidemiological information.  Fact Sheet for Patients:   BoilerBrush.com.cy  Fact Sheet for Healthcare Providers: https://pope.com/  This test is not yet approved or  cleared by the [place] FDA and has been authorized for  detection and/or diagnosis of SARS-CoV-2 by FDA under an Emergency Use Authorization (EUA).  This EUA will remain in effect (meaning this test can be used) for the duration of the COVID-19 declaration under Section 564(b)(1) of the Act, 21 U.S.C. section 360bbb-3(b)(1), unless the authorization is terminated or revoked sooner.  Performed at [facility], [address] [place], [place] [address]     Time coordinating discharge: Over 30 minutes  SIGNED:  Arnetha Courser, MD  [facility] 06/14/2020, 12:57 PM  If 7PM-7AM, please contact night-coverage [email or web address]  This record has been created using Conservation officer, historic buildings. Errors have been sought and corrected,but may not always be located. Such creation errors do not reflect on the standard of care.

## 2020-06-14 NOTE — Progress Notes (Signed)
Patient is impulsive and keeps getting up out of bed to go into the shower. He has disconnected his fluids twice now. Will attempt to give PRN Ativan when patient is back out of shower. Contacted NP to ask about getting a 1:1 sitter for patient. Will continue to monitor.

## 2020-06-14 NOTE — Progress Notes (Signed)
Pt left the floor via wheelchair by the Safety Sitter, ride from the rehab awaits outside. Instructed to get his medication.

## 2020-06-15 ENCOUNTER — Emergency Department
Admission: EM | Admit: 2020-06-15 | Discharge: 2020-06-15 | Disposition: A | Discharge status: Home / Self Care | Payer: Self-pay | Attending: Emergency Medicine | Admitting: Emergency Medicine

## 2020-06-15 ENCOUNTER — Other Ambulatory Visit: Payer: Self-pay

## 2020-06-15 DIAGNOSIS — R10817 Generalized abdominal tenderness: Secondary | ICD-10-CM | POA: Insufficient documentation

## 2020-06-15 DIAGNOSIS — R1115 Cyclical vomiting syndrome unrelated to migraine: Secondary | ICD-10-CM | POA: Insufficient documentation

## 2020-06-15 DIAGNOSIS — F17210 Nicotine dependence, cigarettes, uncomplicated: Secondary | ICD-10-CM | POA: Insufficient documentation

## 2020-06-15 HISTORY — DX: Noninfective gastroenteritis and colitis, unspecified: K52.9

## 2020-06-15 LAB — COMPREHENSIVE METABOLIC PANEL
ALT: 14 U/L (ref 0–44)
AST: 20 U/L (ref 15–41)
Albumin: 5 g/dL (ref 3.5–5.0)
Alkaline Phosphatase: 42 U/L (ref 38–126)
Anion gap: 14 (ref 5–15)
BUN: 16 mg/dL (ref 6–20)
CO2: 22 mmol/L (ref 22–32)
Calcium: 9.7 mg/dL (ref 8.9–10.3)
Chloride: 110 mmol/L (ref 98–111)
Creatinine, Ser: 1.33 mg/dL — ABNORMAL HIGH (ref 0.61–1.24)
GFR calc Af Amer: >60 mL/min (ref >60)
GFR calc non Af Amer: >60 mL/min (ref >60)
Glucose, Bld: 151 mg/dL — ABNORMAL HIGH (ref 70–99)
Potassium: 3.4 mmol/L — ABNORMAL LOW (ref 3.5–5.1)
Sodium: 146 mmol/L — ABNORMAL HIGH (ref 135–145)
Total Bilirubin: 2.1 mg/dL — ABNORMAL HIGH (ref 0.3–1.2)
Total Protein: 8 g/dL (ref 6.5–8.1)

## 2020-06-15 LAB — CBC
HCT: 41.8 % (ref 39.0–52.0)
Hemoglobin: 13.7 g/dL (ref 13.0–17.0)
MCH: 29.9 pg (ref 26.0–34.0)
MCHC: 32.8 g/dL (ref 30.0–36.0)
MCV: 91.3 fL (ref 80.0–100.0)
Platelets: 283 10*3/uL (ref 150–400)
RBC: 4.58 MIL/uL (ref 4.22–5.81)
RDW: 14.3 % (ref 11.5–15.5)
WBC: 9.4 10*3/uL (ref 4.0–10.5)
nRBC: 0 % (ref 0.0–0.2)

## 2020-06-15 LAB — LIPASE, BLOOD: Lipase: 30 U/L (ref 11–51)

## 2020-06-15 MED ORDER — LIDOCAINE VISCOUS HCL 2 % MT SOLN
15.0000 mL | Freq: Once | OROMUCOSAL | Status: AC
  Administered 2020-06-15: 15 mL via ORAL
  Filled 2020-06-15: qty 15

## 2020-06-15 MED ORDER — LACTATED RINGERS IV BOLUS: 1000.0000 mL | Freq: Once | INTRAVENOUS | Status: DC

## 2020-06-15 MED ORDER — PANTOPRAZOLE SODIUM 40 MG PO TBEC
40.0000 mg | DELAYED_RELEASE_TABLET | Freq: Once | ORAL | Status: AC
  Administered 2020-06-15: 40 mg via ORAL
  Filled 2020-06-15: qty 1

## 2020-06-15 MED ORDER — DROPERIDOL 2.5 MG/ML IJ SOLN
5.0000 mg | Freq: Once | INTRAMUSCULAR | Status: AC
  Administered 2020-06-15: 5 mg via INTRAMUSCULAR

## 2020-06-15 MED ORDER — ALUM & MAG HYDROXIDE-SIMETH 200-200-20 MG/5ML PO SUSP
30.0000 mL | Freq: Once | ORAL | Status: AC
  Administered 2020-06-15: 30 mL via ORAL
  Filled 2020-06-15: qty 30

## 2020-06-15 MED ORDER — SODIUM CHLORIDE 0.9% FLUSH: 3.0000 mL | Freq: Once | INTRAVENOUS | Status: DC

## 2020-06-15 MED ORDER — PROMETHAZINE HCL 25 MG PO TABS
25.0000 mg | ORAL_TABLET | Freq: Three times a day (TID) | ORAL | 0 refills | Status: AC | PRN
Start: 2020-06-15 — End: ?

## 2020-06-15 NOTE — ED Notes (Signed)
Pt resting at this time, RR even and unlabored.

## 2020-06-15 NOTE — Progress Notes (Signed)
Notified of patient laying on bathroom floor near the physical rehab area. On arrival, noted patient standing on feet, leaning against counter, appears unsteady on feet.Also noted bathroom floor is covered in a layer of water, patient with shirt off, using shirt in sink as a washcloth. All clothing appears very wet, hair wet, note water on patient's skin.  Patient shivering, wet. States he has severe stomach pain. Placed in wheelchair and taken to ED as patient stated he wanted to be seen, but "doesn't want to have to wait all those minutes because I am really sick and they make really sick people wait to be seen". On arrival to ED for triage, patient got out of wheelchair and went into men's bathroom, starting to retch heavily. Requested he come out of bathroom so he could be seen in triage. He initially refused saying he didn't want to wait to be seen. RN came into bathroom and told patient he had a room ready. Patient got into wheelchair and was taken to room

## 2020-06-15 NOTE — ED Notes (Signed)
Patient laying in floor at the end of the bed covered in a blanket. Even respirations noted. Security remains near patient.

## 2020-06-15 NOTE — ED Notes (Signed)
Pt resting comfortably at this time, RR even and unlabored 

## 2020-06-15 NOTE — ED Provider Notes (Signed)
[facility] Emergency Department Provider Note  ____________________________________________   First MD Initiated Contact with Patient 06/15/20 [phone]     (approximate)  I have reviewed the triage vital signs and the nursing notes.   HISTORY  Chief Complaint Abdominal Pain    HPI Bruce Buchanan is a 44 y.o. male  With h/o drug abuse here with nausea, vomiting. Pt was just admitted for nausea and vomiting. He returns today actively trying to choke himself with three fingers, saying his abdomen hurts and he needs to vomit. He is somewhat combative on exam, but states that he never felt better and this is the same location and quality of pain. It is aching, gnawing, epigastric. He has not had any hematemesis or diarrhea. No fevers. Pain is constant, feels somewhat improved after emesis. No alleviating factors.   Of note, patient has been disruptive in waiting room and has clogged two sinks purposefully with paper towels. He arrives with security watching him.        Past Medical History:  Diagnosis Date  . Colitis   . Drug abuse [facility])     Patient Active Problem List   Diagnosis Date Noted  . Intractable nausea and vomiting 06/13/2020  . Polysubstance abuse ([place]) 06/13/2020  . Tobacco abuse 06/13/2020  . Hypernatremia 06/13/2020  . AKI (acute kidney injury) ([place]) 06/13/2020  . Elevated blood-pressure reading without diagnosis of hypertension 06/13/2020    History reviewed. No pertinent surgical history.  Prior to Admission medications   Medication Sig Start Date End Date Taking? Authorizing Provider  ondansetron (ZOFRAN) 4 MG tablet Take 1 tablet (4 mg total) by mouth every 6 (six) hours as needed for nausea. 06/14/20   Lorella Nimrod, MD  pantoprazole (PROTONIX) 40 MG tablet Take 1 tablet (40 mg total) by mouth daily. 06/14/20 06/14/21  Lorella Nimrod, MD  promethazine (PHENERGAN) 25 MG tablet Take 1 tablet (25 mg total) by mouth every 8 (eight) hours as  needed for nausea or vomiting. 06/15/20   Duffy Bruce, MD    Allergies Patient has no known allergies.  No family history on file.  Social History Social History   Tobacco Use  . Smoking status: Current Every Day Smoker    Types: Cigarettes  . Smokeless tobacco: Never Used  Substance Use Topics  . Alcohol use: Not Currently  . Drug use: Not Currently    Review of Systems  Review of Systems  Constitutional: Positive for fatigue. Negative for chills and fever.  HENT: Negative for sore throat.   Respiratory: Negative for shortness of breath.   Cardiovascular: Negative for chest pain.  Gastrointestinal: Positive for abdominal pain, nausea and vomiting.  Genitourinary: Negative for flank pain.  Musculoskeletal: Negative for neck pain.  Skin: Negative for rash and wound.  Allergic/Immunologic: Negative for immunocompromised state.  Neurological: Negative for weakness and numbness.  Hematological: Does not bruise/bleed easily.  All other systems reviewed and are negative.    ____________________________________________  PHYSICAL EXAM:      VITAL SIGNS: ED Triage Vitals  Enc Vitals Group     BP 06/15/20 0822 (!) 137/97     Pulse Rate 06/15/20 0822 94     Resp 06/15/20 0822 16     Temp 06/15/20 0822 98 F (36.7 C)     Temp Source 06/15/20 0822 Oral     SpO2 06/15/20 0822 99 %     Weight 06/15/20 0823 135 lb (61.2 kg)     Height 06/15/20 0823 5\' 5"  (  1.651 m)     Head Circumference --      Peak Flow --      Pain Score 06/15/20 0823 10     Pain Loc --      Pain Edu? --      Excl. in GC? --      Physical Exam Vitals and nursing note reviewed.  Constitutional:      General: He is not in acute distress.    Appearance: He is well-developed.     Comments: Actively attempting to make himself vomit, sticking 3 fingers into his mouth and throat  HENT:     Head: Normocephalic and atraumatic.  Eyes:     Conjunctiva/sclera: Conjunctivae normal.  Cardiovascular:      Rate and Rhythm: Normal rate and regular rhythm.     Heart sounds: Normal heart sounds. No murmur heard.  No friction rub.  Pulmonary:     Effort: Pulmonary effort is normal. No respiratory distress.     Breath sounds: Normal breath sounds. No wheezing or rales.  Abdominal:     General: There is no distension.     Palpations: Abdomen is soft.     Tenderness: There is abdominal tenderness (diffuse tenderness that resolves when pt distracted, no focal TTP, no rebound or guarding, no distension).  Musculoskeletal:     Cervical back: Neck supple.  Skin:    General: Skin is warm.     Capillary Refill: Capillary refill takes less than 2 seconds.  Neurological:     Mental Status: He is alert and oriented to person, place, and time.     Motor: No abnormal muscle tone.  Psychiatric:     Comments: Labile, yelling at staff. Denies SI, HI, AVH however.       ____________________________________________   LABS (all labs ordered are listed, but only abnormal results are displayed)  Labs Reviewed  COMPREHENSIVE METABOLIC PANEL - Abnormal; Notable for the following components:      Result Value   Sodium 146 (*)    Potassium 3.4 (*)    Glucose, Bld 151 (*)    Creatinine, Ser 1.33 (*)    Total Bilirubin 2.1 (*)    All other components within normal limits  LIPASE, BLOOD  CBC  URINALYSIS, COMPLETE (UACMP) WITH MICROSCOPIC    ____________________________________________  EKG: Normal sinus rhythm, VR 86. PR 146, QRS 90, QTc 414. No acute ST elevation or depressions. ________________________________________  RADIOLOGY All imaging, including plain films, CT scans, and ultrasounds, independently reviewed by me, and interpretations confirmed via formal radiology reads.  ED MD interpretation:   Reviewed prior CT, unremarkable  Official radiology report(s): No results found.  ____________________________________________  PROCEDURES   Procedure(s) performed (including Critical  Care):  Procedures  ____________________________________________  INITIAL IMPRESSION / MDM / ASSESSMENT AND PLAN / ED COURSE  As part of my medical decision making, I reviewed the following data within the electronic MEDICAL RECORD NUMBER Nursing notes reviewed and incorporated, Old chart reviewed, Notes from prior ED visits, and [place] Controlled Substance Database       *Bruce Buchanan was evaluated in Emergency Department on 06/15/2020 for the symptoms described in the history of present illness. He was evaluated in the context of the global COVID-19 pandemic, which necessitated consideration that the patient might be at risk for infection with the SARS-CoV-2 virus that causes COVID-19. Institutional protocols and algorithms that pertain to the evaluation of patients at risk for COVID-19 are in a state of rapid change based on  information released by regulatory bodies including the CDC and federal and state organizations. These policies and algorithms were followed during the patient's care in the ED.  Some ED evaluations and interventions may be delayed as a result of limited staffing during the pandemic.*     Medical Decision Making:  44 yo M here with recurrent nausea, vomiting. Pt was just seen and admitted for same. On arrival, pt actively attempting to choke himself to produce emesis and has clogged multiple toilets/sinks with paper towels despite being told not to. Suspect cyclical vomiting syndrome. Abdomen is soft and his lab work, recent imaging is all reassuring. No signs of emergent pathology. Pt was given droperidol and had significant improvement in nausea and slept for >2 hours, with no vomiting or distress. He became somewhat upset when told he would not receive opiates for his pain, repeatedly requesting for them, but was amenable to attempted outpatient treatment for cyclical vomiting with antiemetics. Discussed that opioids would be contraindicated and possibly harmful for his  condition.  ____________________________________________  FINAL CLINICAL IMPRESSION(S) / ED DIAGNOSES  Final diagnoses:  Cyclical vomiting     MEDICATIONS GIVEN DURING THIS VISIT:  Medications  sodium chloride flush (NS) 0.9 % injection 3 mL (3 mLs Intravenous Not Given 06/15/20 1146)  lactated ringers bolus 1,000 mL (1,000 mLs Intravenous Not Given 06/15/20 1145)  droperidol (INAPSINE) 2.5 MG/ML injection 5 mg (5 mg Intramuscular Given 06/15/20 0938)  alum & mag hydroxide-simeth (MAALOX/MYLANTA) 200-200-20 MG/5ML suspension 30 mL (30 mLs Oral Given 06/15/20 1300)    And  lidocaine (XYLOCAINE) 2 % viscous mouth solution 15 mL (15 mLs Oral Given 06/15/20 1301)  pantoprazole (PROTONIX) EC tablet 40 mg (40 mg Oral Given 06/15/20 1302)     ED Discharge Orders         Ordered    promethazine (PHENERGAN) 25 MG tablet  Every 8 hours PRN     Discontinue  Reprint     06/15/20 1309           Note:  This document was prepared using Dragon voice recognition software and may include unintentional dictation errors.   Shaune Pollack, MD 06/15/20 1954

## 2020-06-15 NOTE — ED Notes (Signed)
Pt escorted to the bathroom with security present, pt attempting to put his fingers down his throat to cause vomiting. Pt is combative.

## 2020-06-15 NOTE — ED Notes (Signed)
Patient brought back from triage. Yelling that he needed to go to the bathroom to throw up. Emesis bag provided for patient. Patient states he does not want to use that but would rather go to bathroom. Patient informed by charge RN that he would not be allowed to use the restroom unsupervised due to behavior in lobby and that he would need to use the emesis bag to throw up in. Security present at bedside. Patient sitting on end of bed, sticking 3 fingers down his throat repeatedly in an effort to make himself throw up. MD over to assess patient.

## 2020-06-15 NOTE — ED Notes (Signed)
Pt given ginger aile as requested 

## 2020-06-15 NOTE — ED Notes (Signed)
This RN contacted [facility] per pt request to update and request pickup for discharge.

## 2020-06-15 NOTE — ED Notes (Signed)
E-sig not available, pt signed paper copy, sent to HIM for scanning into chart

## 2020-06-15 NOTE — ED Notes (Signed)
Pt noted sticking 3-4 fingers down his throat in order to cause vomiting.

## 2020-06-15 NOTE — ED Triage Notes (Signed)
Pt comes into the ED via EMS from home with c/o generalized abd pain with N/V for the past moth, pt was seen here for the same. Pt is seen sticking his finger doen his throat. Also security called out to the lobby bathroom, pt found sitting in the sink with his feet in the sink with his shirt off.

## 2020-06-15 NOTE — ED Notes (Signed)
This RN called [facility] to get update on status pt ride for d/c. Group home states they are working on transportation and will call back when have a time frame.

## 2020-06-15 NOTE — ED Notes (Signed)
Pt noted to have been in the restroom for an extended period of time. Pt found in restroom sitting in the sink with the water running.  Pt states "y'all are a trip man"

## 2020-08-29 ENCOUNTER — Telehealth: Payer: Self-pay | Admitting: Pharmacist

## 2020-08-29 NOTE — Telephone Encounter (Signed)
Patient failed to provide requested 2021 financial documentation. No additional medication assistance will be provided by [facility] without the required proof of income documentation. Patient notified by letter Debra Cheek Administrative Assistant [facility] 

## 2021-06-09 IMAGING — CT CT ABD-PELV W/ CM
2 of 5 series · 16 of 46 positions shown, 18 images · IV contrast (APPLIED)
Comparison: None.

CLINICAL DATA: Abdominal pain, nausea, vomiting.

EXAM:
CT ABDOMEN AND PELVIS WITH CONTRAST
TECHNIQUE: Multidetector CT imaging of the abdomen and pelvis was performed
using the standard protocol following bolus administration of
intravenous contrast.
CONTRAST:  100mL OMNIPAQUE IOHEXOL 300 MG/ML  SOLN

[Series 2: routine abd/pel with · axial · 0.75mm/px · z∈[-792,-407]mm · 13 of 87 slices shown, 15 images]
[im 5/87  soft-tissue]
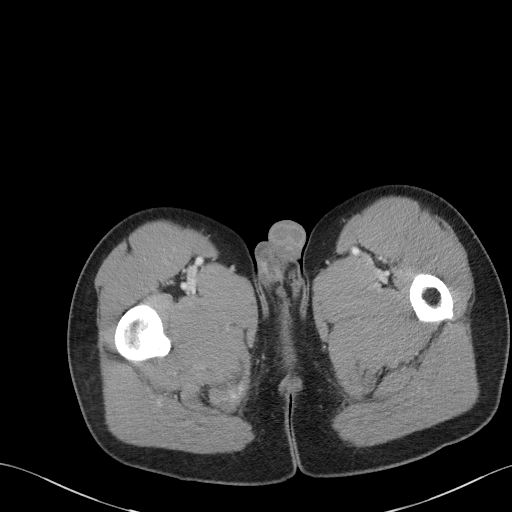
[im 5/87  bone]
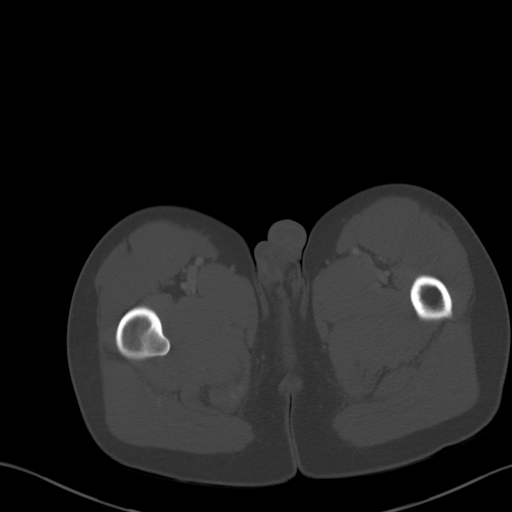
[im 10/87  soft-tissue]
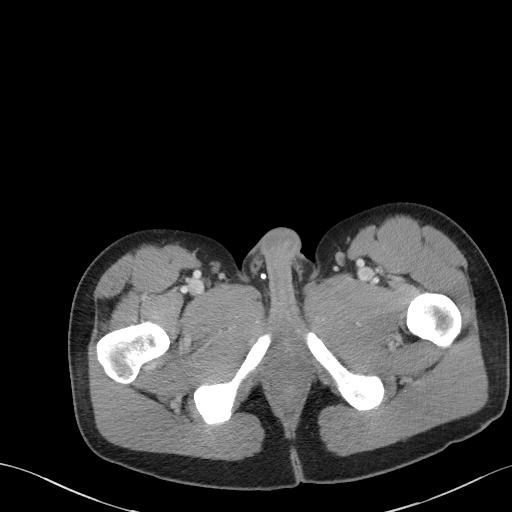
[im 20/87  soft-tissue]
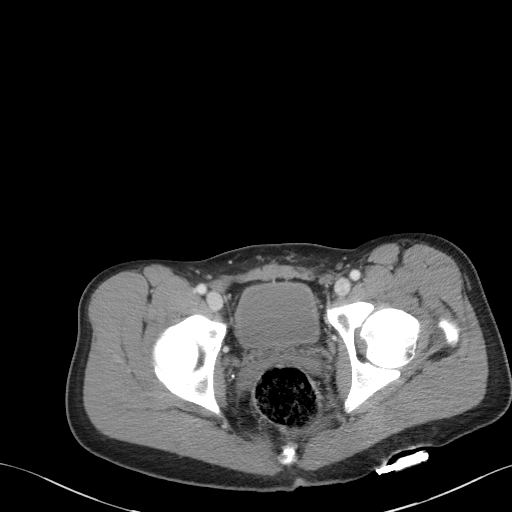
[im 24/87  soft-tissue]
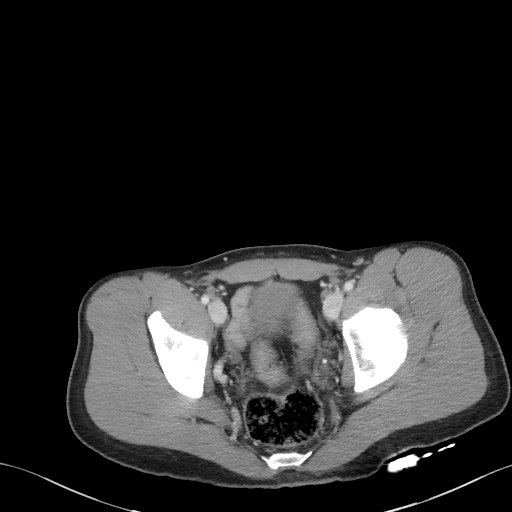
[im 29/87  soft-tissue]
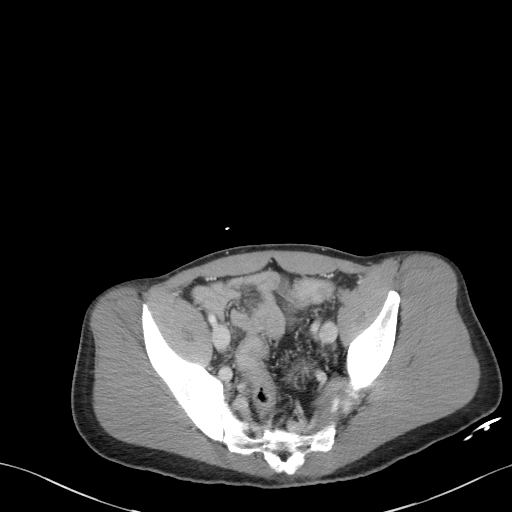
[im 39/87  soft-tissue]
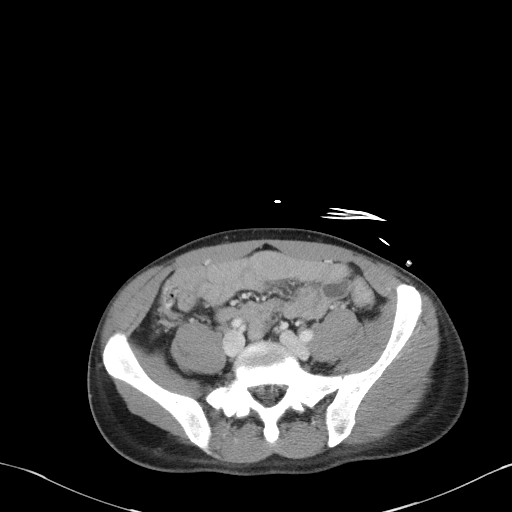
[im 44/87  soft-tissue]
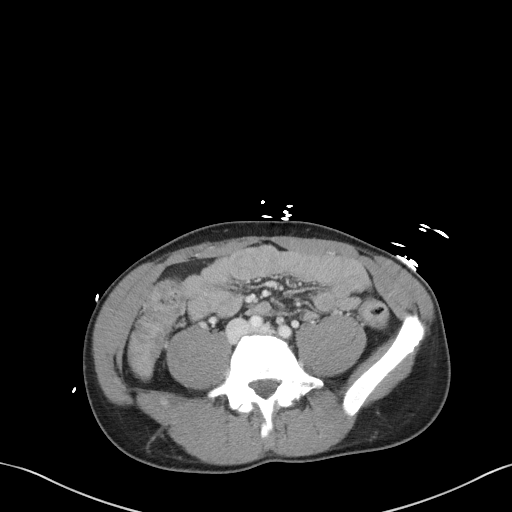
[im 48/87  soft-tissue]
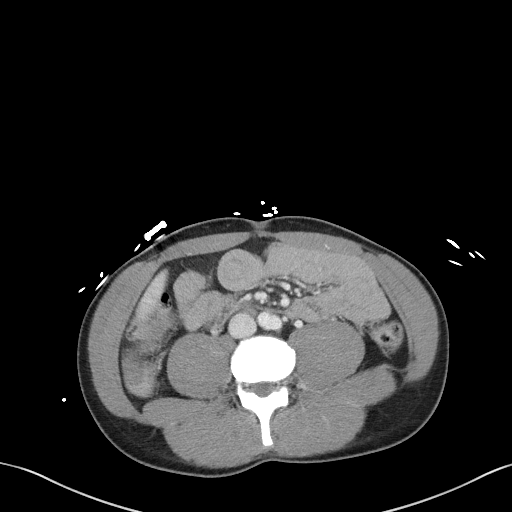
[im 58/87  soft-tissue]
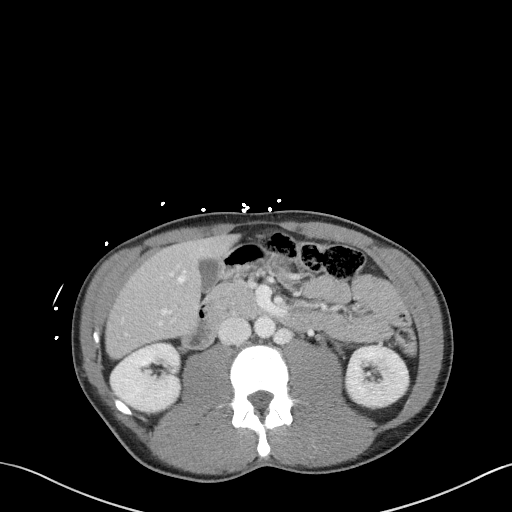
[im 58/87  bone]
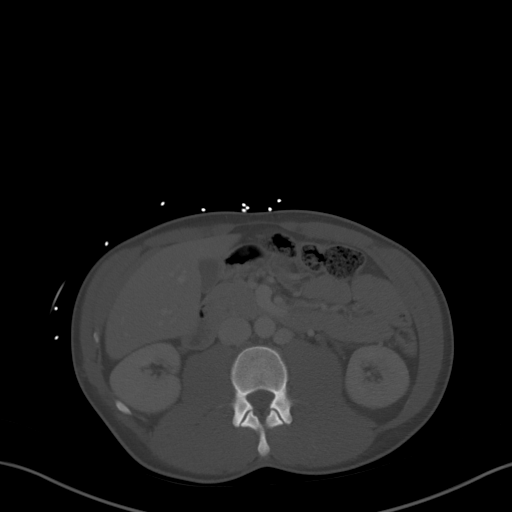
[im 63/87  soft-tissue]
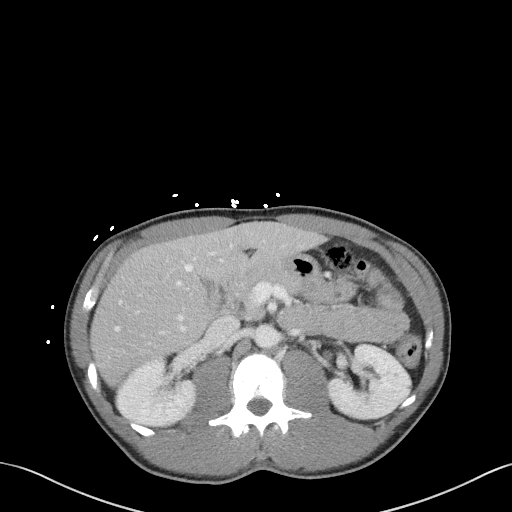
[im 67/87  soft-tissue]
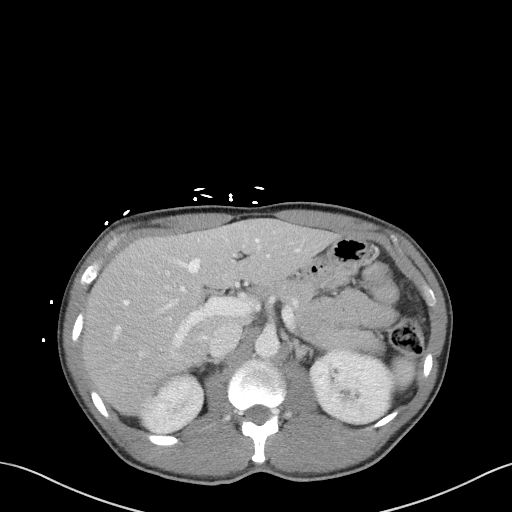
[im 77/87  soft-tissue]
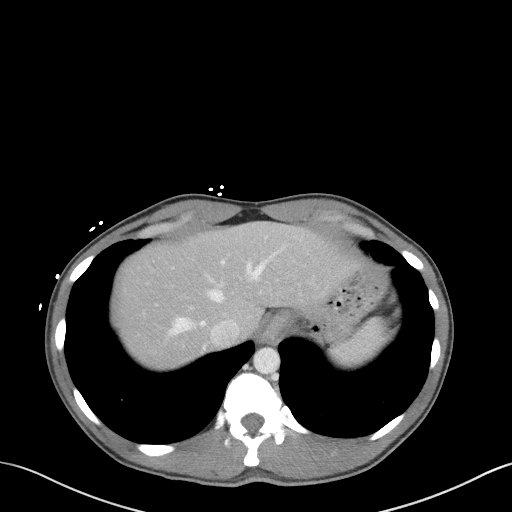
[im 82/87  soft-tissue]
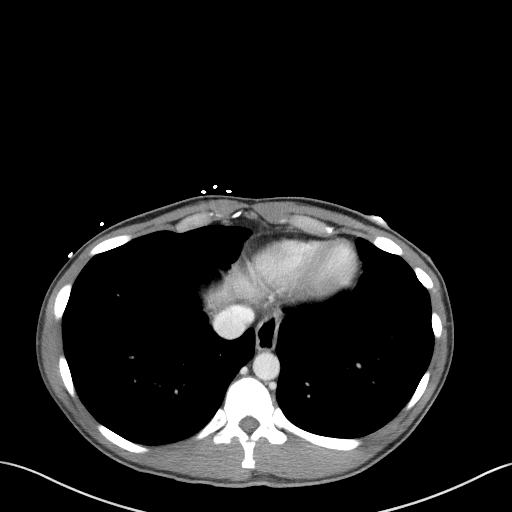

[Series 5: coronal st · coronal · 0.66mm/px · 3 of 75 slices shown]
[im 25/75  soft-tissue]
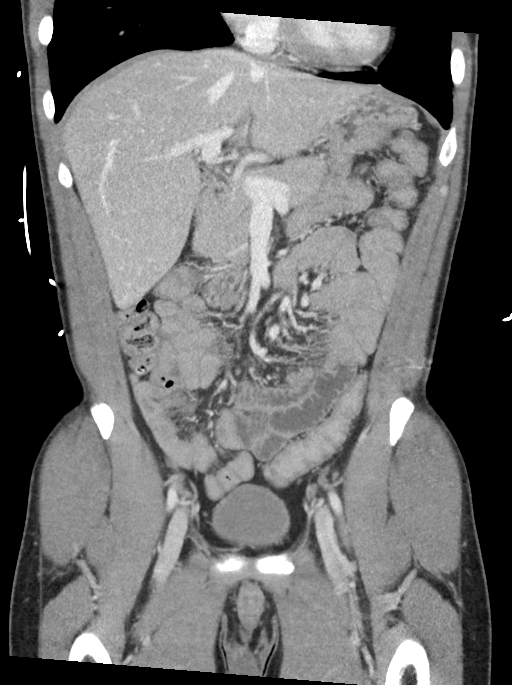
[im 33/75  soft-tissue]
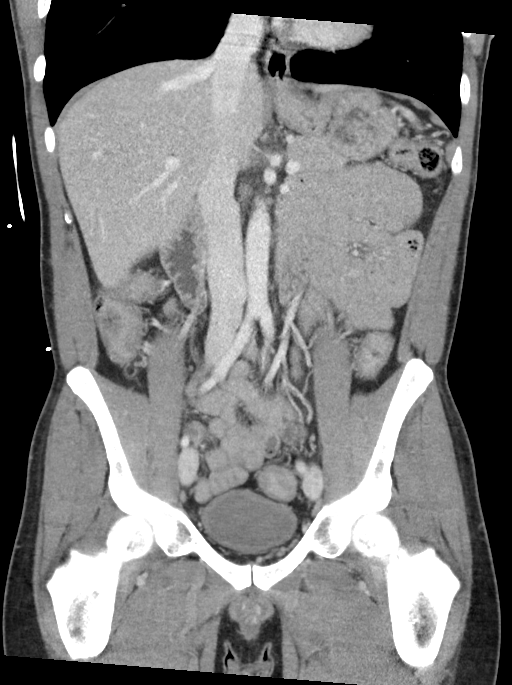
[im 42/75  soft-tissue]
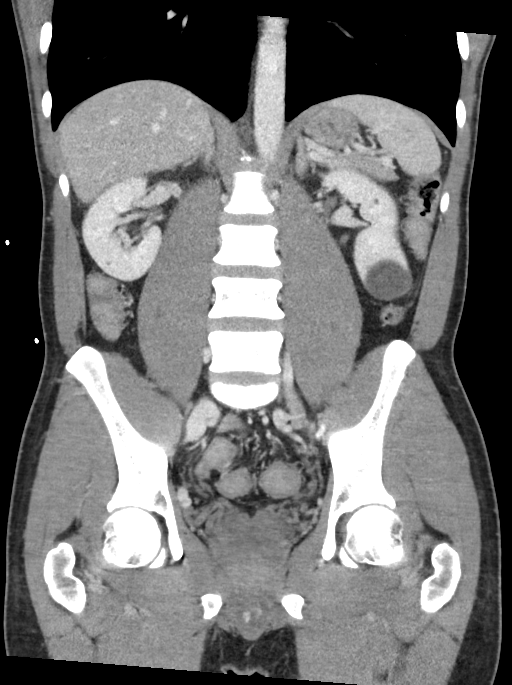

[16 of 46 positions shown; findings below may reference images not displayed]

FINDINGS: Lower chest: No acute abnormality.

Hepatobiliary: No focal liver abnormality is seen. No gallstones,
gallbladder wall thickening, or biliary dilatation.

Pancreas: Unremarkable. No pancreatic ductal dilatation or
surrounding inflammatory changes.

Spleen: Normal in size without focal abnormality.

Adrenals/Urinary Tract: Adrenal glands appear normal. 3.8 cm simple
cyst is seen in lower pole of right kidney. No hydronephrosis or
renal obstruction is noted. No renal or ureteral calculi are noted.
Urinary bladder is unremarkable.

Stomach/Bowel: Stomach is within normal limits. Appendix appears
normal. No evidence of bowel wall thickening, distention, or
inflammatory changes.

Vascular/Lymphatic: No significant vascular findings are present. No
enlarged abdominal or pelvic lymph nodes.

Reproductive: Prostate is unremarkable.

Other: No abdominal wall hernia or abnormality. No abdominopelvic
ascites.

Musculoskeletal: No acute or significant osseous findings.
IMPRESSION: No acute abnormality seen in the abdomen or pelvis.
